# Patient Record
Sex: Male | Born: 1990 | Race: White | Hispanic: No | Marital: Single | Smoking: Current every day smoker
Health system: Southern US, Community
[De-identification: ages and names within clinical notes are randomized; demographics above are authoritative.]

## PROBLEM LIST (undated history)

## (undated) DIAGNOSIS — F419 Anxiety disorder, unspecified: Secondary | ICD-10-CM

## (undated) DIAGNOSIS — T401X1A Poisoning by heroin, accidental (unintentional), initial encounter: Secondary | ICD-10-CM

---
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
## 2011-02-08 ENCOUNTER — Emergency Department (HOSPITAL_COMMUNITY)
Admission: EM | Admit: 2011-02-08 | Discharge: 2011-02-08 | Disposition: A | Discharge status: Home / Self Care | Payer: Self-pay | Attending: Emergency Medicine | Admitting: Emergency Medicine

## 2011-02-08 DIAGNOSIS — F1210 Cannabis abuse, uncomplicated: Secondary | ICD-10-CM | POA: Insufficient documentation

## 2011-02-08 DIAGNOSIS — F1110 Opioid abuse, uncomplicated: Secondary | ICD-10-CM | POA: Insufficient documentation

## 2011-02-08 LAB — CBC
Hemoglobin: 15.4 g/dL (ref 13.0–17.0)
Platelets: 170 10*3/uL (ref 150–400)
RBC: 4.73 MIL/uL (ref 4.22–5.81)
WBC: 7.8 10*3/uL (ref 4.0–10.5)

## 2011-02-08 LAB — DIFFERENTIAL
Basophils Absolute: 0 10*3/uL (ref 0.0–0.1)
Basophils Relative: 0 % (ref 0–1)
Neutro Abs: 5.4 10*3/uL (ref 1.7–7.7)
Neutrophils Relative %: 69 % (ref 43–77)

## 2011-02-08 LAB — COMPREHENSIVE METABOLIC PANEL
ALT: 24 U/L (ref 0–53)
AST: 23 U/L (ref 0–37)
Albumin: 4.1 g/dL (ref 3.5–5.2)
CO2: 25 mEq/L (ref 19–32)
Chloride: 106 mEq/L (ref 96–112)
GFR calc Af Amer: >60 mL/min (ref >60)
GFR calc non Af Amer: >60 mL/min (ref >60)
Sodium: 137 mEq/L (ref 135–145)
Total Bilirubin: 1 mg/dL (ref 0.3–1.2)

## 2011-02-08 LAB — RAPID URINE DRUG SCREEN, HOSP PERFORMED
Amphetamines: NOT DETECTED
Benzodiazepines: NOT DETECTED
Tetrahydrocannabinol: POSITIVE — AB

## 2013-04-29 ENCOUNTER — Emergency Department (HOSPITAL_COMMUNITY)
Admission: EM | Admit: 2013-04-29 | Discharge: 2013-04-29 | Disposition: A | Discharge status: Home / Self Care | Payer: 59 | Source: Home / Self Care

## 2013-04-29 ENCOUNTER — Encounter (HOSPITAL_COMMUNITY): Payer: Self-pay

## 2013-04-29 DIAGNOSIS — F419 Anxiety disorder, unspecified: Secondary | ICD-10-CM

## 2013-04-29 DIAGNOSIS — L255 Unspecified contact dermatitis due to plants, except food: Secondary | ICD-10-CM

## 2013-04-29 DIAGNOSIS — F4329 Adjustment disorder with other symptoms: Secondary | ICD-10-CM

## 2013-04-29 DIAGNOSIS — F438 Other reactions to severe stress: Secondary | ICD-10-CM

## 2013-04-29 DIAGNOSIS — F411 Generalized anxiety disorder: Secondary | ICD-10-CM

## 2013-04-29 HISTORY — DX: Anxiety disorder, unspecified: F41.9

## 2013-04-29 MED ORDER — TRIAMCINOLONE ACETONIDE 40 MG/ML IJ SUSP
INTRAMUSCULAR | Status: AC
  Filled 2013-04-29: qty 1

## 2013-04-29 MED ORDER — HYDROXYZINE HCL 50 MG PO TABS: 50.0000 mg | ORAL_TABLET | Freq: Three times a day (TID) | ORAL | Status: DC | PRN

## 2013-04-29 MED ORDER — BUSPIRONE HCL 10 MG PO TABS: 10.0000 mg | ORAL_TABLET | Freq: Two times a day (BID) | ORAL | Status: DC

## 2013-04-29 MED ORDER — TRIAMCINOLONE ACETONIDE 40 MG/ML IJ SUSP
60.0000 mg | Freq: Once | INTRAMUSCULAR | Status: AC
  Administered 2013-04-29: 60 mg via INTRAMUSCULAR

## 2013-04-29 MED ORDER — PREDNISONE 10 MG PO KIT: PACK | ORAL | Status: DC

## 2013-04-29 NOTE — ED Provider Notes (Signed)
Medical screening examination/treatment/procedure(s) were performed by non-physician practitioner and as supervising physician I was immediately available for consultation/collaboration.  Leslee Home, M.D.  Reuben Likes, MD 04/29/13 [phone]

## 2013-04-29 NOTE — ED Provider Notes (Signed)
   History    CSN: [id number] Arrival date & time 04/29/13  1159  First MD Initiated Contact with Patient 04/29/13 1343     Chief Complaint  Patient presents with  . Rash   (Consider location/radiation/quality/duration/timing/severity/associated sxs/prior Treatment) HPI Comments: 21 year old male presents with generalized papular vesicular rash scattered about his extremities and trunk. 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 He is complaining of intense itching.  Second complaint is that of increased anxiety, worried, multiple domestic problems coping mechanism problems financial problems and is wanting something for his nerves. He states these problems started one to 2 weeks ago. He was asking for her Xanax.  Past Medical History  Diagnosis Date  . Anxiety    History reviewed. No pertinent past surgical history. History reviewed. No pertinent family history. History  Substance Use Topics  . Smoking status: Current Every Day Smoker  . Smokeless tobacco: Not on file  . Alcohol Use: Yes    Review of Systems  Constitutional: Negative.   Skin: Positive for rash.  Neurological: Negative.   Psychiatric/Behavioral: Positive for sleep disturbance, decreased concentration and agitation. The patient is nervous/anxious.     Allergies  Review of patient's allergies indicates no known allergies.  Home Medications   Current Outpatient Rx  Name  Route  Sig  Dispense  Refill  . hydrOXYzine (ATARAX/VISTARIL) 50 MG tablet   Oral   Take 1 tablet (50 mg total) by mouth 3 (three) times daily as needed for itching.   30 tablet   0   . PredniSONE 10 MG KIT      Take as directed.                    Sterapred dosepack for 12 days   1 kit   0    BP 130/72  Pulse 63  Temp(Src) 98.4 F (36.9 C) (Oral)  Resp 16  SpO2 100% Physical Exam  Nursing note and vitals reviewed. Constitutional: He is oriented to person, place, and time. He appears  well-developed and well-nourished. No distress.  Neck: Normal range of motion. Neck supple.  Cardiovascular: Normal rate, regular rhythm and normal heart sounds.   Pulmonary/Chest: Effort normal.  Musculoskeletal: Normal range of motion. He exhibits no tenderness.  Neurological: He is alert and oriented to person, place, and time. He exhibits normal muscle tone.  Skin:  Red papular vesicular rash only anterior and posterior trunk upper and lower extremities. Many of these lesions are weeping. No signs of infection.    ED Course  Procedures (including critical care time) Labs Reviewed - No data to display No results found. 1. Rhus dermatitis   2. Anxiety disorder   3. Stress and adjustment reaction     MDM  Sterapred DS 12 day Dosepak as directed Hydroxyzine 50 mg 3 times a day when necessary itching and anxiety Kenalog 60 mg IM Recommend he go to behavioral health as soon as possible for anxiety treatment and oral call the number listed above to obtain a PCP. BuSpar 10 mg twice a day Will not be able to get prescription for Xanax as requested.  Hayden Rasmussen, NP 04/29/13 1432

## 2013-04-29 NOTE — ED Notes (Signed)
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

## 2013-05-16 ENCOUNTER — Emergency Department (INDEPENDENT_AMBULATORY_CARE_PROVIDER_SITE_OTHER)
Admission: EM | Admit: 2013-05-16 | Discharge: 2013-05-16 | Disposition: A | Discharge status: Home / Self Care | Payer: 59 | Source: Home / Self Care | Attending: Family Medicine | Admitting: Family Medicine

## 2013-05-16 ENCOUNTER — Encounter: Payer: Self-pay | Admitting: Emergency Medicine

## 2013-05-16 DIAGNOSIS — S61211A Laceration without foreign body of left index finger without damage to nail, initial encounter: Secondary | ICD-10-CM

## 2013-05-16 DIAGNOSIS — S61209A Unspecified open wound of unspecified finger without damage to nail, initial encounter: Secondary | ICD-10-CM

## 2013-05-16 MED ORDER — HYDROCODONE-ACETAMINOPHEN 5-325 MG PO TABS: 1.0000 | ORAL_TABLET | Freq: Four times a day (QID) | ORAL | Status: DC | PRN

## 2013-05-16 MED ORDER — CEPHALEXIN 500 MG PO CAPS: 500.0000 mg | ORAL_CAPSULE | Freq: Three times a day (TID) | ORAL | Status: DC

## 2013-05-16 NOTE — ED Notes (Signed)
Left index finger lac with a chain saw

## 2013-05-16 NOTE — ED Provider Notes (Signed)
History    CSN: [id number] Arrival date & time 05/16/13  1449  First MD Initiated Contact with Patient 05/16/13 1502     Chief Complaint  Patient presents with  . Extremity Laceration      HPI Comments: Shortly before arrival patient cut his left second fingertip with chain saw.  Last Tdap about 2 years ago.  Patient is a 22 y.o. male presenting with skin laceration. The history is provided by the patient.  Laceration Location:  Finger Finger laceration location:  L index finger Length (cm):  2 Depth:  Through dermis Quality: jagged and stellate   Bleeding: controlled   Time since incident:  1 hour Injury mechanism: chain saw blade. Pain details:    Quality:  Aching   Severity:  Mild   Timing:  Constant   Progression:  Unchanged Foreign body present:  No foreign bodies Relieved by:  Nothing Worsened by:  Movement Ineffective treatments:  Pressure Tetanus status:  Up to date  Past Medical History  Diagnosis Date  . Anxiety    History reviewed. No pertinent past surgical history. No family history on file. History  Substance Use Topics  . Smoking status: Current Every Day Smoker  . Smokeless tobacco: Not on file  . Alcohol Use: Yes    Review of Systems  All other systems reviewed and are negative.    Allergies  Review of patient's allergies indicates not on file.  Home Medications   Current Outpatient Rx  Name  Route  Sig  Dispense  Refill  . busPIRone (BUSPAR) 10 MG tablet   Oral   Take 1 tablet (10 mg total) by mouth 2 (two) times daily.   60 tablet   0   . cephALEXin (KEFLEX) 500 MG capsule   Oral   Take 1 capsule (500 mg total) by mouth 3 (three) times daily.   30 capsule   0   . HYDROcodone-acetaminophen (NORCO/VICODIN) 5-325 MG per tablet   Oral   Take 1 tablet by mouth every 6 (six) hours as needed for pain.   10 tablet   0   . hydrOXYzine (ATARAX/VISTARIL) 50 MG tablet   Oral   Take 1 tablet (50 mg total) by mouth 3 (three) times  daily as needed for itching.   30 tablet   0   . PredniSONE 10 MG KIT      Take as directed.                    Sterapred dosepack for 12 days   1 kit   0    BP 153/82  Pulse 86  Temp(Src) 98.6 F (37 C) (Oral)  Ht 6\' 2"  (1.88 m)  Wt 190 lb (86.183 kg)  BMI 24.38 kg/m2  SpO2 97% Physical Exam  Nursing note and vitals reviewed. Constitutional: He is oriented to person, place, and time. He appears well-developed and well-nourished. No distress.  HENT:  Head: Atraumatic.  Eyes: Conjunctivae are normal. Pupils are equal, round, and reactive to light.  Musculoskeletal: Normal range of motion.       Left hand: He exhibits tenderness and laceration. He exhibits normal range of motion, no bony tenderness, normal two-point discrimination, normal capillary refill, no deformity and no swelling. Normal sensation noted. Normal strength noted.       Hands: Left second finger distal phalanx has a superficial full-thickness stellate irregular laceration as noted on diagram.  No debris in wound.  Distal phalanx flexion/extension and sensation  is intact.  Good cap refill distally.  Neurological: He is alert and oriented to person, place, and time.  Skin: Skin is warm and dry.    ED Course  Procedures  Laceration Repair Discussed benefits and risks of procedure and verbal consent obtained. Using sterile technique and digital 2% lidocaine without epinephrine, cleansed wound with Betadine followed by copious lavage with normal saline.  Wound carefully inspected for debris and foreign bodies; none found.  Several small epidermal fragments debrided.  Wound closed with #8, 4-0 interrupted nylon sutures.  Applied Xeroform gauze followed by light sterile dressing.  Wound precautions explained to patient.  Return for suture removal in 10 to 12 days.    1. Laceration of index finger of left hand without complication, initial encounter     MDM  Empirically begin Keflex.  Lortab for pain Keep wound  clean and dry.  Elevate hand.  Leave present dressing in place until follow-up visit in 48 hours. Return for follow-up in 48 hours.  Lattie Haw, MD 05/16/13 [phone]

## 2013-05-17 ENCOUNTER — Telehealth: Payer: Self-pay | Admitting: *Deleted

## 2013-05-17 ENCOUNTER — Emergency Department (INDEPENDENT_AMBULATORY_CARE_PROVIDER_SITE_OTHER)
Admission: EM | Admit: 2013-05-17 | Discharge: 2013-05-17 | Disposition: A | Discharge status: Home / Self Care | Payer: 59 | Source: Home / Self Care

## 2013-05-17 ENCOUNTER — Encounter: Payer: Self-pay | Admitting: *Deleted

## 2013-05-17 DIAGNOSIS — S61209A Unspecified open wound of unspecified finger without damage to nail, initial encounter: Secondary | ICD-10-CM

## 2013-05-17 DIAGNOSIS — S61218D Laceration without foreign body of other finger without damage to nail, subsequent encounter: Secondary | ICD-10-CM

## 2013-05-17 MED ORDER — OXYCODONE HCL 5 MG PO TABA: 5.0000 mg | ORAL_TABLET | Freq: Four times a day (QID) | ORAL | Status: DC | PRN

## 2013-05-17 NOTE — ED Notes (Signed)
Christopher Hoover is here for re-check of the laceration to left index finger. Site has continued to bleed since placement of sutures yesterday and pain has worsened. Dressing removed, site cleaned, cleox clotting applied.

## 2013-05-17 NOTE — ED Provider Notes (Signed)
History    CSN: [id number] Arrival date & time 05/17/13  1615  None    Chief Complaint  Patient presents with  . Wound Check   (Consider location/radiation/quality/duration/timing/severity/associated sxs/prior Treatment) HPI  See previous note for details on laceration of left index finger.  Patient claims that he is in a lot of pain and he normally cannot take acetaminophen-containing medicines so he did not take much of the Vicodin that he was prescribed since it upsets his stomach.  In addition he noted that gauze had been bled through this morning when he woke up and still seems to be oozing blood.  He says the pain now the numbing medicine has worn off is fairly severe and throbbing and constant.  He is back for followup.  He is also taking his antibiotic as prescribed.  Past Medical History  Diagnosis Date  . Anxiety    History reviewed. No pertinent past surgical history. History reviewed. No pertinent family history. History  Substance Use Topics  . Smoking status: Current Every Day Smoker  . Smokeless tobacco: Not on file  . Alcohol Use: Yes    Review of Systems  All other systems reviewed and are negative.    Allergies  Review of patient's allergies indicates no known allergies.  Home Medications   Current Outpatient Rx  Name  Route  Sig  Dispense  Refill  . busPIRone (BUSPAR) 10 MG tablet   Oral   Take 1 tablet (10 mg total) by mouth 2 (two) times daily.   60 tablet   0   . cephALEXin (KEFLEX) 500 MG capsule   Oral   Take 1 capsule (500 mg total) by mouth 3 (three) times daily.   30 capsule   0   . HYDROcodone-acetaminophen (NORCO/VICODIN) 5-325 MG per tablet   Oral   Take 1 tablet by mouth every 6 (six) hours as needed for pain.   10 tablet   0   . hydrOXYzine (ATARAX/VISTARIL) 50 MG tablet   Oral   Take 1 tablet (50 mg total) by mouth 3 (three) times daily as needed for itching.   30 tablet   0   . OxyCODONE HCl, Abuse Deter, 5 MG TABA  Oral   Take 5 mg by mouth every 6 (six) hours as needed.   20 tablet   0    BP 124/81  Pulse 66  Temp(Src) 98.3 F (36.8 C) (Oral)  Resp 16 Physical Exam  Nursing note and vitals reviewed. Constitutional: He is oriented to person, place, and time. He appears well-developed and well-nourished.  HENT:  Head: Normocephalic and atraumatic.  Eyes: No scleral icterus.  Neck: Neck supple.  Cardiovascular: Regular rhythm and normal heart sounds.   Pulmonary/Chest: Effort normal and breath sounds normal. No respiratory distress.  Neurological: He is alert and oriented to person, place, and time.  Skin: Skin is warm and dry.  Laceration of the distal left index finger is present.  There is mild oozing of blood from the wound especially around the nailbed.  Full range of motion of MCP, PIP, and DIP.  Distal neurovascular status is intact.  Moderate tenderness to palpation.  Psychiatric: He has a normal mood and affect. His speech is normal.    ED Course  Procedures (including critical care time) Labs Reviewed - No data to display No results found. 1. Laceration of index finger, subsequent encounter     MDM   The patient did have continued oozing of blood from  the wound.  We placed a thick clot powder on the wound, covered it with nonstick Vaseline dressing, and placed in a finger splint and wrapped with Coban and.  Patient advised to use ice frequently and elevate.  He was also changed from Vicodin to oxycodone since he has a previous problem with Tylenol-containing medicines.  Otherwise the patient know that it would probably be sore and throbbing for at least the next few weeks.  If the wound continues to bleed despite these measures, then we'll need to refer him to a hand specialist.  Patient understands and agrees to this plan.  Marlaine Hind, MD 05/17/13 [phone]

## 2013-12-27 ENCOUNTER — Encounter (HOSPITAL_COMMUNITY): Payer: Self-pay | Admitting: Emergency Medicine

## 2013-12-27 ENCOUNTER — Emergency Department (INDEPENDENT_AMBULATORY_CARE_PROVIDER_SITE_OTHER): Payer: 59

## 2013-12-27 ENCOUNTER — Emergency Department (INDEPENDENT_AMBULATORY_CARE_PROVIDER_SITE_OTHER)
Admission: EM | Admit: 2013-12-27 | Discharge: 2013-12-27 | Disposition: A | Discharge status: Home / Self Care | Payer: Self-pay | Source: Home / Self Care | Attending: Emergency Medicine | Admitting: Emergency Medicine

## 2013-12-27 DIAGNOSIS — S43499A Other sprain of unspecified shoulder joint, initial encounter: Secondary | ICD-10-CM

## 2013-12-27 DIAGNOSIS — S46811A Strain of other muscles, fascia and tendons at shoulder and upper arm level, right arm, initial encounter: Secondary | ICD-10-CM

## 2013-12-27 DIAGNOSIS — S46819A Strain of other muscles, fascia and tendons at shoulder and upper arm level, unspecified arm, initial encounter: Secondary | ICD-10-CM

## 2013-12-27 MED ORDER — NAPROXEN 375 MG PO TABS: 375.0000 mg | ORAL_TABLET | Freq: Two times a day (BID) | ORAL | Status: DC | PRN

## 2013-12-27 NOTE — Discharge Instructions (Signed)
Your xrays were without evidence of bony injury. A muscle strain can take 4-6 weeks to heal and if your symptoms do not begin to improve over that period of time, you should seek re-evaluation at the orthopedist's office listed on your discharge paperwork. You may use the medication as prescribed for your discomfort.
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
## 2013-12-27 NOTE — ED Notes (Signed)
Xray disc being prepared for patient

## 2013-12-27 NOTE — ED Notes (Signed)
Patient has multiple questions

## 2013-12-27 NOTE — ED Provider Notes (Signed)
Medical screening examination/treatment/procedure(s) were performed by non-physician practitioner and as supervising physician I was immediately available for consultation/collaboration.  Leslee Homeavid Yehudis Monceaux, M.D.  Reuben Likesavid C Ebony Yorio, MD 12/27/13 [phone]

## 2014-05-08 ENCOUNTER — Emergency Department (HOSPITAL_COMMUNITY)
Admission: EM | Admit: 2014-05-08 | Discharge: 2014-05-08 | Disposition: A | Discharge status: Home / Self Care | Payer: 59 | Attending: Emergency Medicine | Admitting: Emergency Medicine

## 2014-05-08 ENCOUNTER — Telehealth (HOSPITAL_COMMUNITY): Payer: Self-pay

## 2014-05-08 DIAGNOSIS — R5381 Other malaise: Secondary | ICD-10-CM | POA: Insufficient documentation

## 2014-05-08 DIAGNOSIS — R001 Bradycardia, unspecified: Secondary | ICD-10-CM

## 2014-05-08 DIAGNOSIS — F1310 Sedative, hypnotic or anxiolytic abuse, uncomplicated: Secondary | ICD-10-CM | POA: Insufficient documentation

## 2014-05-08 DIAGNOSIS — F1210 Cannabis abuse, uncomplicated: Secondary | ICD-10-CM | POA: Insufficient documentation

## 2014-05-08 DIAGNOSIS — F17200 Nicotine dependence, unspecified, uncomplicated: Secondary | ICD-10-CM | POA: Insufficient documentation

## 2014-05-08 DIAGNOSIS — I498 Other specified cardiac arrhythmias: Secondary | ICD-10-CM | POA: Insufficient documentation

## 2014-05-08 DIAGNOSIS — F1910 Other psychoactive substance abuse, uncomplicated: Secondary | ICD-10-CM

## 2014-05-08 DIAGNOSIS — F911 Conduct disorder, childhood-onset type: Secondary | ICD-10-CM | POA: Insufficient documentation

## 2014-05-08 DIAGNOSIS — R5383 Other fatigue: Secondary | ICD-10-CM

## 2014-05-08 LAB — RAPID URINE DRUG SCREEN, HOSP PERFORMED
Amphetamines: NOT DETECTED
BARBITURATES: NOT DETECTED
Benzodiazepines: POSITIVE — AB
Cocaine: NOT DETECTED
Opiates: NOT DETECTED
TETRAHYDROCANNABINOL: POSITIVE — AB

## 2014-05-08 LAB — CBC WITH DIFFERENTIAL/PLATELET
BASOS ABS: 0 10*3/uL (ref 0.0–0.1)
Basophils Relative: 0 % (ref 0–1)
Eosinophils Absolute: 0.2 10*3/uL (ref 0.0–0.7)
Eosinophils Relative: 3 % (ref 0–5)
HEMATOCRIT: 37.8 % — AB (ref 39.0–52.0)
Hemoglobin: 13.1 g/dL (ref 13.0–17.0)
LYMPHS PCT: 33 % (ref 12–46)
Lymphs Abs: 2.3 10*3/uL (ref 0.7–4.0)
MCH: 30.8 pg (ref 26.0–34.0)
MCHC: 34.7 g/dL (ref 30.0–36.0)
MCV: 88.7 fL (ref 78.0–100.0)
MONO ABS: 0.4 10*3/uL (ref 0.1–1.0)
Monocytes Relative: 6 % (ref 3–12)
NEUTROS ABS: 4 10*3/uL (ref 1.7–7.7)
Neutrophils Relative %: 58 % (ref 43–77)
PLATELETS: 161 10*3/uL (ref 150–400)
RBC: 4.26 MIL/uL (ref 4.22–5.81)
RDW: 12 % (ref 11.5–15.5)
WBC: 6.8 10*3/uL (ref 4.0–10.5)

## 2014-05-08 LAB — COMPREHENSIVE METABOLIC PANEL
ALT: 17 U/L (ref 0–53)
AST: 16 U/L (ref 0–37)
Albumin: 3.6 g/dL (ref 3.5–5.2)
Alkaline Phosphatase: 79 U/L (ref 39–117)
Anion gap: 12 (ref 5–15)
BILIRUBIN TOTAL: 0.4 mg/dL (ref 0.3–1.2)
BUN: 16 mg/dL (ref 6–23)
CHLORIDE: 102 meq/L (ref 96–112)
CO2: 26 meq/L (ref 19–32)
Calcium: 8.8 mg/dL (ref 8.4–10.5)
Creatinine, Ser: 0.76 mg/dL (ref 0.50–1.35)
GFR calc Af Amer: >90 mL/min (ref >90)
Glucose, Bld: 120 mg/dL — ABNORMAL HIGH (ref 70–99)
Potassium: 3.6 mEq/L — ABNORMAL LOW (ref 3.7–5.3)
SODIUM: 140 meq/L (ref 137–147)
Total Protein: 6.7 g/dL (ref 6.0–8.3)

## 2014-05-08 LAB — ETHANOL: Alcohol, Ethyl (B): <11 mg/dL (ref 0–11)

## 2014-05-08 MED ORDER — SODIUM CHLORIDE 0.9 % IV SOLN
1000.0000 mL | Freq: Once | INTRAVENOUS | Status: AC
  Administered 2014-05-08: 1000 mL via INTRAVENOUS

## 2014-05-08 MED ORDER — SODIUM CHLORIDE 0.9 % IV SOLN: 1000.0000 mL | Freq: Once | INTRAVENOUS | Status: DC

## 2014-05-08 MED ORDER — SODIUM CHLORIDE 0.9 % IV SOLN
1000.0000 mL | INTRAVENOUS | Status: DC
  Administered 2014-05-08: 1000 mL via INTRAVENOUS

## 2014-05-08 NOTE — ED Notes (Signed)
Hx. Of substance abuse. According to family, "pt. Been clean for awhile; pt. Disappeared from gf ~ 1830 - came home ~ 2300 - been drinking, passing out; family called EMS called - pt. Hr. Between 38 65; pt. Took 6 clonopin; pt. Ambulatory and weak in triage.

## 2014-05-08 NOTE — ED Notes (Signed)
Iv started and nss bolus

## 2014-05-08 NOTE — Discharge Instructions (Signed)
It is very important for you to avoid drugs and alcohol.  During her evaluation in the emergency department tonight, urine noticed to have very low heart rate and low blood pressure.  Increase her fluid intake.  Rest.  Return to the emergency department for weakness numbness or episodes of passing out.  It is important for you to followup with cardiology for further workup of your slow heart rate.   Bradycardia Bradycardia is a term for a heart rate (pulse) that, in adults, is slower than 60 beats per minute. A normal rate is 60 to 100 beats per minute. A heart rate below 60 beats per minute may be normal for some adults with healthy hearts. If the rate is too slow, the heart may have trouble pumping the volume of blood the body needs. If the heart rate gets too low, blood flow to the brain may be decreased and may make you feel lightheaded, dizzy, or faint. The heart has a natural pacemaker in the top of the heart called the SA node (sinoatrial or sinus node). This pacemaker sends out regular electrical signals to the muscle of the heart, telling the heart muscle when to beat (contract). The electrical signal travels from the upper parts of the heart (atria) through the AV node (atrioventricular node), to the lower chambers of the heart (ventricles). The ventricles squeeze, pumping the blood from your heart to your lungs and to the rest of your body. CAUSES   Problem with the heart's electrical system.  Problem with the heart's natural pacemaker.  Heart disease, damage, or infection.  Medications.  Problems with minerals and salts (electrolytes). SYMPTOMS   Fainting (syncope).  Fatigue and weakness.  Shortness of breath (dyspnea).  Chest pain (angina).  Drowsiness.  Confusion. DIAGNOSIS   An electrocardiogram (ECG) can help your caregiver determine the type of slow heart rate you have.  If the cause is not seen on an ECG, you may need to wear a heart monitor that records your heart  rhythm for several hours or days.  Blood tests. TREATMENT   Electrolyte supplements.  Medications.  Withholding medication which is causing a slow heart rate.  Pacemaker placement. SEEK IMMEDIATE MEDICAL CARE IF:   You feel lightheaded or faint.  You develop an irregular heart rate.  You feel chest pain or have trouble breathing. MAKE SURE YOU:   Understand these instructions.  Will watch your condition.  Will get help right away if you are not doing well or get worse. Document Released: 07/05/2002 Document Revised: 01/05/2012 Document Reviewed: 05/31/2008 [facility] Patient Information 2015 [place], [place]. This information is not intended to replace advice given to you by your health care provider. Make sure you discuss any questions you have with your health care provider.  Drug Abuse and Addiction in Sports There are many types of drugs that one may become addicted to including illegal drugs (marijuana, cocaine, amphetamines, hallucinogens, and narcotics), prescription drugs (hydrocodone, codeine, and alprazolam), and other chemicals such as alcohol or nicotine. Two types of addiction exist: physical and emotional. Physical addiction usually occurs after prolonged use of a drug. However, some drugs may only take a couple uses before addiction can occur. Physical addiction is marked by withdrawal symptoms, in which the person experiences negative symptoms such as sweat, anxiety, tremors, hallucinations, or cravings in the absence of using the drug. Emotional dependence is the psychological desire for the "high" that the drugs produce when taken. SYMPTOMS   Inattentiveness.  Negligence.  Forgetfulness.  Insomnia.  Mood swings. RISK INCREASES WITH:   Family history of addiction.  Personal history of addictive personality. Studies have shown that risktakers, which many athletes are, have a higher risk of addiction. PREVENTION The only adequate prevention of drug abuse  is abstinence from drugs. TREATMENT  The first step in quitting substance abuse is recognizing the problem and realizing that one has the power to change. Quitting requires a plan and support from others. It is often necessary to seek medical assistance. Caregivers are available to offer counseling, and for certain cases, medicine to diminish the physical symptoms of withdrawal. Many organizations exist such as Alcoholics Anonymous, Narcotics Anonymous, or the [employer] that offer support for individuals who have chosen to quit their habits. Document Released: 10/13/2005 Document Revised: 01/05/2012 Document Reviewed: 01/25/2009 [facility] Patient Information 2015 [place], [place]. This information is not intended to replace advice given to you by your health care provider. Make sure you discuss any questions you have with your health care provider.

## 2014-05-08 NOTE — ED Notes (Signed)
The pt is on his 3rd liter of  Nss.  More awake now hr 40 sb

## 2014-05-08 NOTE — ED Provider Notes (Signed)
CSN: [id number]     Arrival date & time 05/08/14  0000 History   First MD Initiated Contact with Patient 05/08/14 0025     Chief Complaint  Patient presents with  . Bradycardia  . Addiction Problem  . Drug Overdose     (Consider location/radiation/quality/duration/timing/severity/associated sxs/prior Treatment) HPI 23 year old male presents to the emergency department from home via EMS with altered mental status.  He reports that family called 911 as they were concerned about him not breathing.  Patient reports that he took 5 or 6 of an unknown benzodiazepine either Klonopin or Xanax.  He thinks they were 0.5 mg.  Patient also reports drinking a beer.  He denies any other coingestions.  Patient denies any suicide attempt.  He reports he took the pills to get high.  Patient noted to be somnolent and bradycardic. Past Medical History  Diagnosis Date  . Anxiety    No past surgical history on file. No family history on file. History  Substance Use Topics  . Smoking status: Current Every Day Smoker  . Smokeless tobacco: Not on file  . Alcohol Use: Yes    Review of Systems  Unable to perform ROS: Mental status change      Allergies  Review of patient's allergies indicates no known allergies.  Home Medications   Prior to Admission medications   Not on File   BP 118/62  Pulse 62  Temp(Src) 97.7 F (36.5 C) (Oral)  Resp 12  SpO2 99% Physical Exam  Nursing note and vitals reviewed. Constitutional: He is oriented to person, place, and time. He appears well-developed and well-nourished.  HENT:  Head: Normocephalic and atraumatic.  Nose: Nose normal.  Mouth/Throat: Oropharynx is clear and moist.  Eyes: Conjunctivae and EOM are normal. Pupils are equal, round, and reactive to light.  Neck: Normal range of motion. Neck supple. No JVD present. No tracheal deviation present. No thyromegaly present.  Cardiovascular: Regular rhythm, normal heart sounds and intact distal pulses.   Exam reveals no gallop and no friction rub.   No murmur heard. Bradycardia  Pulmonary/Chest: Effort normal and breath sounds normal. No stridor. No respiratory distress. He has no wheezes. He has no rales. He exhibits no tenderness.  Abdominal: Soft. Bowel sounds are normal. He exhibits no distension and no mass. There is no tenderness. There is no rebound and no guarding.  Musculoskeletal: Normal range of motion. He exhibits no edema and no tenderness.  Lymphadenopathy:    He has no cervical adenopathy.  Neurological: He is oriented to person, place, and time. He exhibits normal muscle tone. Coordination normal.  Patient is somnolent.  He rouses briefly with verbal stimulation and then falls quickly back to sleep  Skin: Skin is warm and dry. No rash noted. No erythema. No pallor.  Psychiatric:  Poor insight and judgment, patient is angry    ED Course  Procedures (including critical care time) Labs Review Labs Reviewed  CBC WITH DIFFERENTIAL - Abnormal; Notable for the following:    HCT 37.8 (*)    All other components within normal limits  COMPREHENSIVE METABOLIC PANEL - Abnormal; Notable for the following:    Potassium 3.6 (*)    Glucose, Bld 120 (*)    All other components within normal limits  ETHANOL  URINE RAPID DRUG SCREEN (HOSP PERFORMED)    Imaging Review No results found.   EKG Interpretation   Date/Time:  Monday May 08 2014 00:13:26 EDT Ventricular Rate:  48 PR Interval:  163 QRS  Duration: 91 QT Interval:  458 QTC Calculation: 409 R Axis:   1 Text Interpretation:  Slow sinus arrhythmia RSR' in V1 or V2, probably  normal variant ST elev, probable normal early repol pattern Confirmed by  Kaige Whistler  MD, Willian Donson ([id number]) on 05/08/2014 12:32:29 AM      MDM   Final diagnoses:  Substance abuse  Bradycardia    23 year old male with polysubstance abuse, somnolence.  Patient not safe for discharge at this time given his somnolence and bradycardia.  I am not sure if  there are are other coingestions.  Patient refusing labs.  Will keep patient here for monitoring for at least 4 hours, and hopefully we'll be able to obtain labs  4:12 AM Pt with +orthostatics after 4 hours monitoring, will give ivf bolus.  6:58 AM Improved orthostatics after fluids, still with low resting HR.  OK for d/c, recommending to f/u with outpatient cardiology  Olivia Mackie, MD 05/08/14 [phone]

## 2014-05-08 NOTE — ED Notes (Signed)
The pt is still sleeping family remains at the bedside.  Heart rate is 38 sinus brady

## 2014-05-08 NOTE — ED Notes (Signed)
The pt is drowsy grumpy he does not want to stay but with his father at bedside he  Has agreed to co-operate now.   He responds to questions.  Heart rate low 40-50s sinus brady

## 2014-05-08 NOTE — ED Notes (Signed)
Up in the hallway walking.  Minimal assistance needed.  Asking for a coke following his walk.    given

## 2014-06-26 ENCOUNTER — Encounter (HOSPITAL_COMMUNITY): Payer: Self-pay | Admitting: Emergency Medicine

## 2014-06-26 ENCOUNTER — Emergency Department (HOSPITAL_COMMUNITY)
Admission: EM | Admit: 2014-06-26 | Discharge: 2014-06-26 | Disposition: A | Discharge status: Home / Self Care | Payer: 59 | Attending: Emergency Medicine | Admitting: Emergency Medicine

## 2014-06-26 DIAGNOSIS — R5381 Other malaise: Secondary | ICD-10-CM | POA: Insufficient documentation

## 2014-06-26 DIAGNOSIS — F1911 Other psychoactive substance abuse, in remission: Secondary | ICD-10-CM

## 2014-06-26 DIAGNOSIS — F1910 Other psychoactive substance abuse, uncomplicated: Secondary | ICD-10-CM | POA: Insufficient documentation

## 2014-06-26 DIAGNOSIS — F17200 Nicotine dependence, unspecified, uncomplicated: Secondary | ICD-10-CM | POA: Diagnosis not present

## 2014-06-26 DIAGNOSIS — Y939 Activity, unspecified: Secondary | ICD-10-CM | POA: Insufficient documentation

## 2014-06-26 DIAGNOSIS — Y929 Unspecified place or not applicable: Secondary | ICD-10-CM | POA: Diagnosis not present

## 2014-06-26 DIAGNOSIS — T23279A Burn of second degree of unspecified wrist, initial encounter: Secondary | ICD-10-CM | POA: Diagnosis not present

## 2014-06-26 DIAGNOSIS — X58XXXA Exposure to other specified factors, initial encounter: Secondary | ICD-10-CM | POA: Diagnosis not present

## 2014-06-26 DIAGNOSIS — R5383 Other fatigue: Secondary | ICD-10-CM | POA: Diagnosis not present

## 2014-06-26 DIAGNOSIS — Z008 Encounter for other general examination: Secondary | ICD-10-CM | POA: Insufficient documentation

## 2014-06-26 MED ORDER — SILVER SULFADIAZINE 1 % EX CREA
TOPICAL_CREAM | Freq: Once | CUTANEOUS | Status: AC
  Administered 2014-06-26: 22:00:00 via TOPICAL
  Filled 2014-06-26: qty 50

## 2014-06-26 NOTE — Discharge Instructions (Signed)
Return for evaluation if you feel you are going to do something to harm yourself or someone else.

## 2014-06-26 NOTE — ED Notes (Signed)
Per [employer] pt comes in from [place] under IVC paperwork that sates: Pt hasnt been diagnosed with any mental health disorder but has sought treatment for drug abuse issues.  Family came to magistrate office in order to seek assistance for respondent son and his drug issues.  Family relates that respondent started out with Heroin addiction and has changed to prescription pills and smoking some unknown substances.  Family relates that respondent has apparently been drug seeking from several local doctors receiving thousands of dollars of prescription medication.  Family also relates that respondent (son) has had several encounters with law enforcement, recent as two nights ago getting DWI while under influence of narcotics.  Family is concerned for son's safety and well being as he has had medical emergency from overdose of prescription pills.
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
## 2015-04-30 ENCOUNTER — Ambulatory Visit (INDEPENDENT_AMBULATORY_CARE_PROVIDER_SITE_OTHER): Payer: 59 | Admitting: Internal Medicine

## 2015-04-30 VITALS — BP 132/68 | HR 62 | Temp 98.3°F | Resp 18 | Ht 74.5 in | Wt 162.0 lb

## 2015-04-30 DIAGNOSIS — Z139 Encounter for screening, unspecified: Secondary | ICD-10-CM

## 2015-04-30 DIAGNOSIS — Z111 Encounter for screening for respiratory tuberculosis: Secondary | ICD-10-CM

## 2015-04-30 DIAGNOSIS — Z114 Encounter for screening for human immunodeficiency virus [HIV]: Secondary | ICD-10-CM

## 2015-04-30 NOTE — Patient Instructions (Signed)
We need to interpret the TB skin test between 3:45 PM on Wednesday and 3:45 PM on Thursday to give you your final results We can give you your blood test results for HIV at the same time If needed we will be happy to fax these results to the center in [place] if you get us the fax number

## 2015-04-30 NOTE — Progress Notes (Signed)
   Subjective:  This chart was scribed for Christopher Siaobert Karysa Heft, MD by [facility] Hoover, [facility] sc[facility].The patient was seen in exam room 12 and the patient's care was started at 3:28 PM.   Patient ID: Christopher Hoover, male    DOB: 1990/11/14, 24 y.o.   MRN: [id number] Chief Complaint  Patient presents with  . PPD Placement  . Blood work    HIV, to get into a program   HPI  HPI Comments: Christopher RetortJon T Hoover is a 24 y.o. male who presents to [facility] complaining of PPD placement and HIV blood work for acceptance into a substance abuse program. He does not have a coughing illness and  not been out of the country.   Past Medical History  Diagnosis Date  . Anxiety    Prior to Admission medications   Medication Sig Start Date End Date Taking? Authorizing Provider  naphazoline-glycerin (CLEAR EYES) 0.012-0.2 % SOLN Place 1-2 drops into both eyes every 4 (four) hours as needed for irritation.    Historical Provider, MD  Vitamins A & D (VITAMIN A & D) ointment Apply 1 application topically 3 (three) times daily as needed for dry skin (wound).    Historical Provider, MD   No Known Allergies  Review of Systems     Objective:  BP 132/68 mmHg  Pulse 62  Temp(Src) 98.3 F (36.8 C) (Oral)  Resp 18  Ht 6' 2.5" (1.892 m)  Wt 162 lb (73.483 kg)  BMI 20.53 kg/m2  SpO2 99% Physical Exam  Constitutional: He is oriented to person, place, and time. He appears well-developed and well-nourished. No distress.  HENT:  Head: Normocephalic and atraumatic.  Eyes: Pupils are equal, round, and reactive to light.  Neck: Normal range of motion.  Cardiovascular: Normal rate and regular rhythm.   Pulmonary/Chest: Effort normal. No respiratory distress.  Musculoskeletal: Normal range of motion.  Neurological: He is alert and oriented to person, place, and time.  Skin: Skin is warm and dry.  Psychiatric: He has a normal mood and affect. His behavior is  normal.  Nursing note and vitals reviewed.      Assessment & Plan:  Screening - Plan: HIV antibody, TB Skin Test    I have completed the patient encounter in its entirety as documented by the scribe, with editing by me where necessary. Leshawn Straka P. Merla Richesoolittle, M.D.

## 2015-04-30 NOTE — Progress Notes (Signed)
  Tuberculosis Risk Questionnaire  1. No Were you born outside the [place] in one of the following parts of the world: [place], [place], [place], [place] or [place]?    2. No Have you traveled outside the [place] and lived for more than one month in one of the following parts of the world: [place], [place], [place], [place] or [place]?    3. No Do you have a compromised immune system such as from any of the following conditions:HIV/AIDS, organ or bone marrow transplantation, diabetes, immunosuppressive medicines (e.g. Prednisone, Remicaide), leukemia, lymphoma, cancer of the head or neck, gastrectomy or jejunal bypass, end-stage renal disease (on dialysis), or silicosis?     4. No Have you ever or do you plan on working in: a residential care center, a health care facility, a jail or prison or homeless shelter?    5. Yes  Have you ever: injected illegal drugs, used crack cocaine, lived in a homeless shelter  or been in jail or prison?     6. No Have you ever been exposed to anyone with infectious tuberculosis?    Tuberculosis Symptom Questionnaire  Do you currently have any of the following symptoms?  1. No Unexplained cough lasting more than 3 weeks?   2. No Unexplained fever lasting more than 3 weeks.   3. No Night Sweats (sweating that leaves the bedclothes and sheets wet)     4. No Shortness of Breath   5. No Chest Pain   6. Yes  Unintentional weight loss    7. No Unexplained fatigue (very tired for no reason)

## 2015-05-01 LAB — HIV ANTIBODY (ROUTINE TESTING W REFLEX): HIV: NONREACTIVE

## 2015-05-02 ENCOUNTER — Ambulatory Visit (INDEPENDENT_AMBULATORY_CARE_PROVIDER_SITE_OTHER): Payer: 59

## 2015-05-02 DIAGNOSIS — Z111 Encounter for screening for respiratory tuberculosis: Secondary | ICD-10-CM

## 2015-05-02 LAB — TB SKIN TEST
Induration: 0 mm
TB Skin Test: NEGATIVE

## 2015-05-05 ENCOUNTER — Encounter: Payer: Self-pay | Admitting: Family Medicine

## 2015-06-07 ENCOUNTER — Emergency Department (HOSPITAL_COMMUNITY)
Admission: EM | Admit: 2015-06-07 | Discharge: 2015-06-07 | Disposition: A | Discharge status: Home / Self Care | Payer: 59 | Attending: Emergency Medicine | Admitting: Emergency Medicine

## 2015-06-07 ENCOUNTER — Encounter (HOSPITAL_COMMUNITY): Payer: Self-pay | Admitting: *Deleted

## 2015-06-07 DIAGNOSIS — Y9389 Activity, other specified: Secondary | ICD-10-CM | POA: Insufficient documentation

## 2015-06-07 DIAGNOSIS — Y998 Other external cause status: Secondary | ICD-10-CM | POA: Insufficient documentation

## 2015-06-07 DIAGNOSIS — Y9289 Other specified places as the place of occurrence of the external cause: Secondary | ICD-10-CM | POA: Insufficient documentation

## 2015-06-07 DIAGNOSIS — F1410 Cocaine abuse, uncomplicated: Secondary | ICD-10-CM | POA: Diagnosis not present

## 2015-06-07 DIAGNOSIS — Z720 Tobacco use: Secondary | ICD-10-CM | POA: Diagnosis not present

## 2015-06-07 DIAGNOSIS — F1210 Cannabis abuse, uncomplicated: Secondary | ICD-10-CM | POA: Diagnosis not present

## 2015-06-07 DIAGNOSIS — T401X1A Poisoning by heroin, accidental (unintentional), initial encounter: Secondary | ICD-10-CM

## 2015-06-07 DIAGNOSIS — Z79899 Other long term (current) drug therapy: Secondary | ICD-10-CM | POA: Insufficient documentation

## 2015-06-07 DIAGNOSIS — F1110 Opioid abuse, uncomplicated: Secondary | ICD-10-CM | POA: Diagnosis not present

## 2015-06-07 DIAGNOSIS — F419 Anxiety disorder, unspecified: Secondary | ICD-10-CM | POA: Diagnosis not present

## 2015-06-07 DIAGNOSIS — F1910 Other psychoactive substance abuse, uncomplicated: Secondary | ICD-10-CM

## 2015-06-07 HISTORY — DX: Poisoning by heroin, accidental (unintentional), initial encounter: T40.1X1A

## 2015-06-07 LAB — RAPID URINE DRUG SCREEN, HOSP PERFORMED
Amphetamines: NOT DETECTED
Barbiturates: NOT DETECTED
Benzodiazepines: NOT DETECTED
Cocaine: POSITIVE — AB
Opiates: POSITIVE — AB
TETRAHYDROCANNABINOL: POSITIVE — AB

## 2015-06-07 LAB — CBC
HEMATOCRIT: 41.9 % (ref 39.0–52.0)
Hemoglobin: 14.5 g/dL (ref 13.0–17.0)
MCH: 31.6 pg (ref 26.0–34.0)
MCHC: 34.6 g/dL (ref 30.0–36.0)
MCV: 91.3 fL (ref 78.0–100.0)
PLATELETS: 204 10*3/uL (ref 150–400)
RBC: 4.59 MIL/uL (ref 4.22–5.81)
RDW: 12 % (ref 11.5–15.5)
WBC: 14.1 10*3/uL — AB (ref 4.0–10.5)

## 2015-06-07 LAB — COMPREHENSIVE METABOLIC PANEL
ALT: 17 U/L (ref 17–63)
ANION GAP: 8 (ref 5–15)
AST: 21 U/L (ref 15–41)
Albumin: 4.2 g/dL (ref 3.5–5.0)
Alkaline Phosphatase: 72 U/L (ref 38–126)
BUN: 16 mg/dL (ref 6–20)
CO2: 25 mmol/L (ref 22–32)
Calcium: 8.7 mg/dL — ABNORMAL LOW (ref 8.9–10.3)
Chloride: 106 mmol/L (ref 101–111)
Creatinine, Ser: 1.01 mg/dL (ref 0.61–1.24)
GFR calc Af Amer: >60 mL/min (ref >60)
GFR calc non Af Amer: >60 mL/min (ref >60)
Glucose, Bld: 133 mg/dL — ABNORMAL HIGH (ref 65–99)
POTASSIUM: 4 mmol/L (ref 3.5–5.1)
Sodium: 139 mmol/L (ref 135–145)
Total Bilirubin: 0.7 mg/dL (ref 0.3–1.2)
Total Protein: 7.3 g/dL (ref 6.5–8.1)

## 2015-06-07 LAB — ETHANOL: Alcohol, Ethyl (B): <5 mg/dL (ref <5)

## 2015-06-07 NOTE — ED Notes (Addendum)
Per EMS, pt from home, Heroin OD, was found by his girlfriend.  With agonal breathing.  Was given Narcan 1 mg intranasally without relief.  Pt reports snorting heroin.  Pt reported to EMT that he has been sober for the last 45 days, wanted a quick fix today.

## 2015-06-07 NOTE — ED Notes (Signed)
Bed: VH84 Expected date:  Expected time:  Means of arrival:  Comments: EMS- heroin overdose

## 2015-06-07 NOTE — Discharge Instructions (Signed)
Follow-up with one of the attached resources to get help with your drug addiction.  Polysubstance Abuse When people abuse more than one drug or type of drug it is called polysubstance or polydrug abuse. For example, many smokers also drink alcohol. This is one form of polydrug abuse. Polydrug abuse also refers to the use of a drug to counteract an unpleasant effect produced by another drug. It may also be used to help with withdrawal from another drug. People who take stimulants may become agitated. Sometimes this agitation is countered with a tranquilizer. This helps protect against the unpleasant side effects. Polydrug abuse also refers to the use of different drugs at the same time.  Anytime drug use is interfering with normal living activities, it has become abuse. This includes problems with family and friends. Psychological dependence has developed when your mind tells you that the drug is needed. This is usually followed by physical dependence which has developed when continuing increases of drug are required to get the same feeling or "high". This is known as addiction or chemical dependency. A person's risk is much higher if there is a history of chemical dependency in the family. SIGNS OF CHEMICAL DEPENDENCY  You have been told by friends or family that drugs have become a problem.  You fight when using drugs.  You are having blackouts (not remembering what you do while using).  You feel sick from using drugs but continue using.  You lie about use or amounts of drugs (chemicals) used.  You need chemicals to get you going.  You are suffering in work performance or in school because of drug use.  You get sick from use of drugs but continue to use anyway.  You need drugs to relate to people or feel comfortable in social situations.  You use drugs to forget problems. "Yes" answered to any of the above signs of chemical dependency indicates there are problems. The longer the use of  drugs continues, the greater the problems will become. If there is a family history of drug or alcohol use, it is best not to experiment with these drugs. Continual use leads to tolerance. After tolerance develops more of the drug is needed to get the same feeling. This is followed by addiction. With addiction, drugs become the most important part of life. It becomes more important to take drugs than participate in the other usual activities of life. This includes relating to friends and family. Addiction is followed by dependency. Dependency is a condition where drugs are now needed not just to get high, but to feel normal. Addiction cannot be cured but it can be stopped. This often requires outside help and the care of professionals. Treatment centers are listed in the yellow pages under: Cocaine, Narcotics, and Alcoholics Anonymous. Most hospitals and clinics can refer you to a specialized care center. Talk to your caregiver if you need help. Document Released: 06/04/2005 Document Revised: 01/05/2012 Document Reviewed: 10/13/2005 [facility] Patient Information 2015 [place], [place]. This information is not intended to replace advice given to you by your health care provider. Make sure you discuss any questions you have with your health care provider.  Accidental Overdose A drug overdose occurs when a chemical substance (drug or medication) is used in amounts large enough to overcome a person. This may result in severe illness or death. This is a type of poisoning. Accidental overdoses of medications or other substances come from a variety of reasons. When this happens accidentally, it is often because  the person taking the substance does not know enough about what they have taken. Drugs which commonly cause overdose deaths are alcohol, psychotropic medications (medications which affect the mind), pain medications, illegal drugs (street drugs) such as cocaine and heroin, and multiple drugs taken at the same  time. It may result from careless behavior (such as over-indulging at a party). Other causes of overdose may include multiple drug use, a lapse in memory, or drug use after a period of no drug use.  Sometimes overdosing occurs because a person cannot remember if they have taken their medication.  A common unintentional overdose in young children involves multi-vitamins containing iron. Iron is a part of the hemoglobin molecule in blood. It is used to transport oxygen to living cells. When taken in small amounts, iron allows the body to restock hemoglobin. In large amounts, it causes problems in the body. If this overdose is not treated, it can lead to death. Never take medicines that show signs of tampering or do not seem quite right. Never take medicines in the dark or in poor lighting. Read the label and check each dose of medicine before you take it. When adults are poisoned, it happens most often through carelessness or lack of information. Taking medicines in the dark or taking medicine prescribed for someone else to treat the same type of problem is a dangerous practice. SYMPTOMS  Symptoms of overdose depend on the medication and amount taken. They can vary from over-activity with stimulant over-dosage, to sleepiness from depressants such as alcohol, narcotics and tranquilizers. Confusion, dizziness, nausea and vomiting may be present. If problems are severe enough coma and death may result. DIAGNOSIS  Diagnosis and management are generally straightforward if the drug is known. Otherwise it is more difficult. At times, certain symptoms and signs exhibited by the patient, or blood tests, can reveal the drug in question.  TREATMENT  In an emergency department, most patients can be treated with supportive measures. Antidotes may be available if there has been an overdose of opioids or benzodiazepines. A rapid improvement will often occur if this is the cause of overdose. At home or away from medical  care:  There may be no immediate problems or warning signs in children.  Not everything works well in all cases of poisoning.  Take immediate action. Poisons may act quickly.  If you think someone has swallowed medicine or a household product, and the person is unconscious, having seizures (convulsions), or is not breathing, immediately call for an ambulance. IF a person is conscious and appears to be doing OK but has swallowed a poison:  Do not wait to see what effect the poison will have. Immediately call a poison control center (listed in the white pages of your telephone book under "Poison Control" or inside the front cover with other emergency numbers). Some poison control centers have TTY capability for the deaf. Check with your local center if you or someone in your family requires this service.  Keep the container so you can read the label on the product for ingredients.  Describe what, when, and how much was taken and the age and condition of the person poisoned. Inform them if the person is vomiting, choking, drowsy, shows a change in color or temperature of skin, is conscious or unconscious, or is convulsing.  Do not cause vomiting unless instructed by medical personnel. Do not induce vomiting or force liquids into a person who is convulsing, unconscious, or very drowsy. Stay calm and in control.  Activated charcoal also is sometimes used in certain types of poisoning and you may wish to add a supply to your emergency medicines. It is available without a prescription. Call a poison control center before using this medication. PREVENTION  Thousands of children die every year from unintentional poisoning. This may be from household chemicals, poisoning from carbon monoxide in a car, taking their parent's medications, or simply taking a few iron pills or vitamins with iron. Poisoning comes from unexpected sources.  Store medicines out of the sight and reach of children, preferably in  a locked cabinet. Do not keep medications in a food cabinet. Always store your medicines in a secure place. Get rid of expired medications.  If you have children living with you or have them as occasional guests, you should have child-resistant caps on your medicine containers. Keep everything out of reach. Child proof your home.  If you are called to the telephone or to answer the door while you are taking a medicine, take the container with you or put the medicine out of the reach of small children.  Do not take your medication in front of children. Do not tell your child how good a medication is and how good it is for them. They may get the idea it is more of a treat.  If you are an adult and have accidentally taken an overdose, you need to consider how this happened and what can be done to prevent it from happening again. If this was from a street drug or alcohol, determine if there is a problem that needs addressing. If you are not sure a problems exists, it is easy to talk to a professional and ask them if they think you have a problem. It is better to handle this problem in this way before it happens again and has a much worse consequence. Document Released: 12/27/2004 Document Revised: 01/05/2012 Document Reviewed: 06/04/2009 [facility] Patient Information 2015 [place], [place]. This information is not intended to replace advice given to you by your health care provider. Make sure you discuss any questions you have with your health care provider.  Opioid Withdrawal Opioids are a group of narcotic drugs. They include the street drug heroin. They also include pain medicines, such as morphine, hydrocodone, oxycodone, and fentanyl. Opioid withdrawal is a group of characteristic physical and mental signs and symptoms. It typically occurs if you have been using opioids daily for several weeks or longer and stop using or rapidly decrease use. Opioid withdrawal can also occur if you have used opioids daily  for a long time and are given a medicine to block the effect.  SIGNS AND SYMPTOMS Opioid withdrawal includes three or more of the following symptoms:   Depressed, anxious, or irritable mood.  Nausea or vomiting.  Muscle aches or spasms.   Watery eyes.   Runny nose.  Dilated pupils, sweating, or hairs standing on end.  Diarrhea or intestinal cramping.  Yawning.   Fever.  Increased blood pressure.  Fast pulse.  Restlessness or trouble sleeping. These signs and symptoms occur within several hours of stopping or reducing short-acting opioids, such as heroin. They can occur within 3 days of stopping or reducing long-acting opioids, such as methadone. Withdrawal begins within minutes of receiving a drug that blocks the effects of opioids, such as naltrexone or naloxone. DIAGNOSIS  Opioid use disorder is diagnosed by your health care provider. You will be asked about your symptoms, drug and alcohol use, medical history, and use of medicines.  A physical exam may be done. Lab tests may be ordered. Your health care provider may have you see a mental health professional.  TREATMENT  The treatment for opioid withdrawal is usually provided by medical doctors with special training in substance use disorders (addiction specialists). The following medicines may be included in treatment:  Opioids given in place of the abused opioid. They turn on opioid receptors in the brain and lessen or prevent withdrawal symptoms. They are gradually decreased (opioid substitution and taper).  Non-opioids that can lessen certain opioid withdrawal symptoms. They may be used alone or with opioid substitution and taper. Successful long-term recovery usually requires medicine, counseling, and group support. HOME CARE INSTRUCTIONS   Take medicines only as directed by your health care provider.  Check with your health care provider before starting new medicines.  Keep all follow-up visits as directed by  your health care provider. SEEK MEDICAL CARE IF:  You are not able to take your medicines as directed.  Your symptoms get worse.  You relapse. SEEK IMMEDIATE MEDICAL CARE IF:  You have serious thoughts about hurting yourself or others.  You have a seizure.  You lose consciousness. Document Released: 10/16/2003 Document Revised: 02/27/2014 Document Reviewed: 10/26/2013 [facility] Patient Information 2015 [place], [place]. This information is not intended to replace advice given to you by your health care provider. Make sure you discuss any questions you have with your health care provider.

## 2015-06-07 NOTE — ED Provider Notes (Signed)
CSN: [id number]     Arrival date & time 06/07/15  1541 History   First MD Initiated Contact with Patient 06/07/15 1549     Chief Complaint  Patient presents with  . Heroin OD      (Consider location/radiation/quality/duration/timing/severity/associated sxs/prior Treatment) HPI Comments: 24 year old male presenting via EMS after accidentally overdosing on heroin. Patient was found by his girlfriend with agonal breathing, EMS was called and given 1 mg Narcan intranasally on arrival. Patient states he was snorting heroin. This was not an attempted suicide, states he was "trying to have fun" since his birthday was yesterday and "one more time wanting matter". He has been sober for the last 45 days. It currently denies any pain, nausea, vomiting, headache, confusion, weakness, tremors, shortness of breath. Has never overdosed in the past. His only complaint currently is that he is upset with himself.  The history is provided by the patient, the EMS personnel and the police.    Past Medical History  Diagnosis Date  . Anxiety   . Heroin overdose    History reviewed. No pertinent past surgical history. No family history on file. Social History  Substance Use Topics  . Smoking status: Current Every Day Smoker  . Smokeless tobacco: None  . Alcohol Use: Yes    Review of Systems  Psychiatric/Behavioral: Negative for suicidal ideas and self-injury.  All other systems reviewed and are negative.     Allergies  Review of patient's allergies indicates no known allergies.  Home Medications   Prior to Admission medications   Medication Sig Start Date End Date Taking? Authorizing Provider  Multiple Vitamins-Minerals (MULTIVITAMIN & MINERAL PO) Take 1 tablet by mouth daily.   Yes Historical Provider, MD  naphazoline-glycerin (CLEAR EYES) 0.012-0.2 % SOLN Place 1-2 drops into both eyes every 4 (four) hours as needed for irritation.   Yes Historical Provider, MD  vitamin B-12 (CYANOCOBALAMIN)  1000 MCG tablet Take 1,000 mcg by mouth daily.   Yes Historical Provider, MD  vitamin E 400 UNIT capsule Take 400 Units by mouth daily.   Yes Historical Provider, MD   BP 118/69 mmHg  Pulse 94  Temp(Src) 98.9 F (37.2 C) (Oral)  Resp 16  SpO2 93% Physical Exam  Constitutional: He is oriented to person, place, and time. He appears well-developed and well-nourished. No distress.  HENT:  Head: Normocephalic and atraumatic.  Eyes: Conjunctivae and EOM are normal. Pupils are equal, round, and reactive to light.  Pinpoint pupils, equal and reactive to light.  Neck: Normal range of motion. Neck supple.  Cardiovascular: Normal rate, regular rhythm and normal heart sounds.   Pulmonary/Chest: Effort normal and breath sounds normal.  Abdominal: Soft. Bowel sounds are normal. He exhibits no distension. There is no tenderness.  Musculoskeletal: Normal range of motion. He exhibits no edema.  Neurological: He is alert and oriented to person, place, and time.  Skin: Skin is warm and dry.  Psychiatric: His behavior is normal. He expresses no homicidal and no suicidal ideation.  Tearful.  Nursing note and vitals reviewed.   ED Course  Procedures (including critical care time) Labs Review Labs Reviewed  CBC - Abnormal; Notable for the following:    WBC 14.1 (*)    All other components within normal limits  COMPREHENSIVE METABOLIC PANEL - Abnormal; Notable for the following:    Glucose, Bld 133 (*)    Calcium 8.7 (*)    All other components within normal limits  URINE RAPID DRUG SCREEN, HOSP PERFORMED - Abnormal;  Notable for the following:    Opiates POSITIVE (*)    Cocaine POSITIVE (*)    Tetrahydrocannabinol POSITIVE (*)    All other components within normal limits  ETHANOL    Imaging Review No results found.   EKG Interpretation None      MDM   Final diagnoses:  Polysubstance abuse  Heroin overdose, accidental or unintentional, initial encounter   Non-toxic appearing, NAD.  AAOx3. No respiratory distress. This was not an intentional OD. Pt given IV fluids. Labs obtained, polysubstance abuse noted on UDS. Patient in ED for over 2 hours and remains stable without any decline. Resources given for help with polysubstance abuse. This was the first time he used 45 days. No signs of withdrawal. Stable for d/c. Return precautions given. Patient states understanding of treatment care plan and is agreeable.  Kathrynn Speed, PA-C 06/08/15 1610  Marily Memos, MD 06/08/15 0120

## 2015-07-10 ENCOUNTER — Ambulatory Visit (INDEPENDENT_AMBULATORY_CARE_PROVIDER_SITE_OTHER): Payer: Self-pay | Admitting: Physician Assistant

## 2015-07-10 VITALS — BP 154/82 | HR 73 | Temp 98.7°F | Resp 18 | Ht 75.0 in | Wt 175.0 lb

## 2015-07-10 DIAGNOSIS — Z113 Encounter for screening for infections with a predominantly sexual mode of transmission: Secondary | ICD-10-CM

## 2015-07-10 DIAGNOSIS — Z111 Encounter for screening for respiratory tuberculosis: Secondary | ICD-10-CM

## 2015-07-10 NOTE — Patient Instructions (Signed)
Please come back to see [place] in 48-72 hours for a TB check.  If you're Hepatitis B and C results are back at that time we'll print them out for you.

## 2015-07-10 NOTE — Progress Notes (Signed)
   Subjective:    Patient ID: Christopher Hoover, male    DOB: [birth year], 24 y.o.   MRN: [id number]  Chief Complaint  Patient presents with  . Annual Exam    for rehab    Medications, allergies, past medical history, surgical history, family history, social history and problem list reviewed and updated.  HPI  7 yom presents needing form filled out for upcoming trip.   Will be taking trip as part of rehab group. Brings form with him he needs filled out in order to go on trip. Needs physical exam and std screening, also needs ppd placed. He denies fevers, chills, rash, ha, chest pain, sob, cough.   Review of Systems See HPI     Objective:   Physical Exam  Constitutional: He is oriented to person, place, and time. He appears well-developed and well-nourished.  Non-toxic appearance. He does not have a sickly appearance. He does not appear ill. No distress.  BP 154/82 mmHg  Pulse 73  Temp(Src) 98.7 F (37.1 C) (Oral)  Resp 18  Ht  (1.905 m)  Wt 175 lb (79.379 kg)  BMI 21.87 kg/m2  SpO2 99%   Neck: Normal range of motion.  Cardiovascular: Normal rate, regular rhythm and normal heart sounds.   Pulmonary/Chest: Effort normal and breath sounds normal.  Abdominal: Soft. Normal appearance and bowel sounds are normal. There is no tenderness.  Neurological: He is alert and oriented to person, place, and time. No cranial nerve deficit.  Skin: Skin is warm, dry and intact. No lesion and no rash noted.      Assessment & Plan:   Screen for STD (sexually transmitted disease) - Plan: Hepatitis C antibody, Hepatitis B surface antibody, Hepatitis B surface antigen  Encounter for PPD test - Plan: TB Skin Test --hep b/c today as pt just had hiv, rpr, gc/ct checked yest at health dept --ppd placed, pt to return in 48-72 hrs for check --will need print out of hepatitis results at that time --form filled out  Donnajean Lopes, PA-C Physician Assistant-Certified [facility]  07/10/2015 1:02 PM

## 2015-07-11 LAB — HEPATITIS B SURFACE ANTIGEN: HEP B S AG: NEGATIVE

## 2015-07-11 LAB — HEPATITIS C ANTIBODY: HCV AB: NEGATIVE

## 2015-07-11 LAB — HEPATITIS B SURFACE ANTIBODY, QUANTITATIVE: Hepatitis B-Post: 0.1 m[IU]/mL

## 2015-07-13 ENCOUNTER — Ambulatory Visit: Payer: Self-pay

## 2015-07-13 DIAGNOSIS — Z111 Encounter for screening for respiratory tuberculosis: Secondary | ICD-10-CM

## 2015-07-13 LAB — TB SKIN TEST
Induration: 0 mm
TB Skin Test: NEGATIVE
